# Patient Record
Sex: Female | Born: 1955 | Race: White | Hispanic: No | Marital: Married | Smoking: Never smoker
Health system: Southern US, Community
[De-identification: ages and names within clinical notes are randomized; demographics above are authoritative.]

## PROBLEM LIST (undated history)

## (undated) HISTORY — PX: ABDOMINOPLASTY: SUR9

## (undated) HISTORY — PX: TONSILLECTOMY: SUR1361

## (undated) HISTORY — PX: ABDOMINAL HYSTERECTOMY: SHX81

## (undated) HISTORY — PX: TUBAL LIGATION: SHX77

---

## 1999-05-18 ENCOUNTER — Other Ambulatory Visit: Admission: RE | Admit: 1999-05-18 | Discharge: 1999-05-18 | Payer: Self-pay | Admitting: Obstetrics & Gynecology

## 2000-04-02 ENCOUNTER — Other Ambulatory Visit: Admission: RE | Admit: 2000-04-02 | Discharge: 2000-04-02 | Payer: Self-pay | Admitting: Obstetrics & Gynecology

## 2000-05-01 ENCOUNTER — Other Ambulatory Visit: Admission: RE | Admit: 2000-05-01 | Discharge: 2000-05-01 | Payer: Self-pay | Admitting: Obstetrics & Gynecology

## 2000-09-20 ENCOUNTER — Other Ambulatory Visit: Admission: RE | Admit: 2000-09-20 | Discharge: 2000-09-20 | Payer: Self-pay | Admitting: Obstetrics & Gynecology

## 2000-11-12 ENCOUNTER — Encounter: Payer: Self-pay | Admitting: Family Medicine

## 2001-01-09 ENCOUNTER — Other Ambulatory Visit: Admission: RE | Admit: 2001-01-09 | Discharge: 2001-01-09 | Payer: Self-pay | Admitting: Obstetrics & Gynecology

## 2001-08-05 ENCOUNTER — Other Ambulatory Visit: Admission: RE | Admit: 2001-08-05 | Discharge: 2001-08-05 | Payer: Self-pay | Admitting: Obstetrics & Gynecology

## 2001-12-30 ENCOUNTER — Encounter: Admission: RE | Admit: 2001-12-30 | Discharge: 2001-12-30 | Payer: Self-pay | Admitting: Obstetrics & Gynecology

## 2001-12-30 ENCOUNTER — Encounter: Payer: Self-pay | Admitting: Obstetrics & Gynecology

## 2002-01-27 ENCOUNTER — Other Ambulatory Visit: Admission: RE | Admit: 2002-01-27 | Discharge: 2002-01-27 | Payer: Self-pay | Admitting: Obstetrics & Gynecology

## 2002-08-06 ENCOUNTER — Other Ambulatory Visit: Admission: RE | Admit: 2002-08-06 | Discharge: 2002-08-06 | Payer: Self-pay | Admitting: Obstetrics & Gynecology

## 2003-01-15 ENCOUNTER — Inpatient Hospital Stay (HOSPITAL_COMMUNITY): Admission: RE | Admit: 2003-01-15 | Discharge: 2003-01-17 | Payer: Self-pay | Admitting: Obstetrics & Gynecology

## 2003-12-28 ENCOUNTER — Encounter: Admission: RE | Admit: 2003-12-28 | Discharge: 2003-12-28 | Payer: Self-pay | Admitting: Obstetrics & Gynecology

## 2004-01-12 ENCOUNTER — Other Ambulatory Visit: Admission: RE | Admit: 2004-01-12 | Discharge: 2004-01-12 | Payer: Self-pay | Admitting: Obstetrics & Gynecology

## 2016-09-26 ENCOUNTER — Other Ambulatory Visit: Payer: Self-pay | Admitting: Dermatology

## 2017-09-19 ENCOUNTER — Emergency Department (HOSPITAL_COMMUNITY): Payer: BLUE CROSS/BLUE SHIELD

## 2017-09-19 ENCOUNTER — Encounter (HOSPITAL_COMMUNITY): Payer: Self-pay | Admitting: *Deleted

## 2017-09-19 ENCOUNTER — Other Ambulatory Visit: Payer: Self-pay

## 2017-09-19 ENCOUNTER — Emergency Department (HOSPITAL_COMMUNITY)
Admission: EM | Admit: 2017-09-19 | Discharge: 2017-09-19 | Disposition: A | Discharge status: Home / Self Care | Payer: BLUE CROSS/BLUE SHIELD | Attending: Emergency Medicine | Admitting: Emergency Medicine

## 2017-09-19 DIAGNOSIS — Y9352 Activity, horseback riding: Secondary | ICD-10-CM | POA: Insufficient documentation

## 2017-09-19 DIAGNOSIS — S4991XA Unspecified injury of right shoulder and upper arm, initial encounter: Secondary | ICD-10-CM | POA: Diagnosis present

## 2017-09-19 DIAGNOSIS — S42021A Displaced fracture of shaft of right clavicle, initial encounter for closed fracture: Secondary | ICD-10-CM | POA: Insufficient documentation

## 2017-09-19 DIAGNOSIS — Y929 Unspecified place or not applicable: Secondary | ICD-10-CM | POA: Insufficient documentation

## 2017-09-19 DIAGNOSIS — Y998 Other external cause status: Secondary | ICD-10-CM | POA: Insufficient documentation

## 2017-09-19 DIAGNOSIS — S20211A Contusion of right front wall of thorax, initial encounter: Secondary | ICD-10-CM | POA: Insufficient documentation

## 2017-09-19 MED ORDER — HYDROCODONE-ACETAMINOPHEN 5-325 MG PO TABS: 1.0000 | ORAL_TABLET | Freq: Four times a day (QID) | ORAL | 0 refills | Status: AC | PRN

## 2017-09-19 MED ORDER — HYDROMORPHONE HCL 1 MG/ML IJ SOLN
0.5000 mg | Freq: Once | INTRAMUSCULAR | Status: AC
  Administered 2017-09-19: 0.5 mg via INTRAVENOUS
  Filled 2017-09-19: qty 1

## 2017-09-19 MED ORDER — KETOROLAC TROMETHAMINE 15 MG/ML IJ SOLN
15.0000 mg | Freq: Once | INTRAMUSCULAR | Status: AC
  Administered 2017-09-19: 15 mg via INTRAVENOUS
  Filled 2017-09-19: qty 1

## 2017-09-19 NOTE — ED Notes (Signed)
Pt stable, ambulatory, states understanding of discharge instructions 

## 2017-09-19 NOTE — Progress Notes (Signed)
Orthopedic Tech Progress Note Patient Details:  Roselee NovaConnie K Forton [birth year] [id number]  Ortho Devices Type of Ortho Device: Sling immobilizer Ortho Device/Splint Location: rue Ortho Device/Splint Interventions: Ordered, Application, Adjustment   Post Interventions Patient Tolerated: Well Instructions Provided: Care of device, Adjustment of device   Trinna PostMartinez, Eathon Valade J 09/19/2017, 10:36 PM

## 2017-09-19 NOTE — ED Triage Notes (Signed)
Pt fell ~165ft from a horse. C/o R clavicle and R ribcage pain. Towel rolled c-collar in place  EMS gave 75mcg Fentanyl enroute

## 2017-09-19 NOTE — ED Notes (Signed)
Ortho tech paged  

## 2017-09-19 NOTE — ED Provider Notes (Signed)
[facility]   CSN: [id number] Arrival date & time: 09/19/17  2020     History   Chief Complaint Chief Complaint  Patient presents with  . Fall    HPI Penny NovaConnie K Daniels is a 62 y.o. female.  HPI   62 year old female presenting after fall from horse.  Happened just before arrival.  She is complaining of pain in her right clavicle/shoulder and her right chest.  She was wearing a helmet.  She denies any significant headache, neck or back pain.  She has been amatory since the accident denies any lower extremity pain hip pain or abdominal pain.  Pain is worse with movement and deep breaths.  He is not anticoagulated.  History reviewed. No pertinent past medical history.  There are no active problems to display for this patient.   Past Surgical History:  Procedure Laterality Date  . ABDOMINAL HYSTERECTOMY    . ABDOMINOPLASTY    . TONSILLECTOMY    . TUBAL LIGATION       OB History   None      Home Medications    Prior to Admission medications   Not on File    Family History No family history on file.  Social History Social History   Tobacco Use  . Smoking status: Never Smoker  . Smokeless tobacco: Never Used  Substance Use Topics  . Alcohol use: Yes  . Drug use: Never     Allergies   Latex   Review of Systems Review of Systems  All systems reviewed and negative, other than as noted in HPI. Physical Exam Updated Vital Signs BP (!) 143/72   Pulse 87   Temp 98.3 F (36.8 C)   Resp 16   SpO2 100%   Physical Exam  Constitutional: She appears well-developed and well-nourished. No distress.  HENT:  Head: Normocephalic and atraumatic.  Eyes: Conjunctivae are normal. Right eye exhibits no discharge. Left eye exhibits no discharge.  Neck: Neck supple.  Cardiovascular: Normal rate, regular rhythm and normal heart sounds. Exam reveals no gallop and no friction rub.  No murmur  heard. Pulmonary/Chest: Effort normal and breath sounds normal. No respiratory distress. She exhibits tenderness.  Tenderness along the right lateral chest wall.  No crepitus.  No overlying skin changes.  Breath sounds are clear and symmetric bilaterally.  Abdominal: Soft. She exhibits no distension. There is no tenderness.  Musculoskeletal: She exhibits no edema or tenderness.  Visible/palpable deformity to the mid right clavicle.  No bony tenderness of the shoulder.  Closed injury.  Neurovascularly intact.  No midline spinal tenderness.  Neurological: She is alert.  Skin: Skin is warm and dry.  Psychiatric: She has a normal mood and affect. Her behavior is normal. Thought content normal.  Nursing note and vitals reviewed.    ED Treatments / Results  Labs (all labs ordered are listed, but only abnormal results are displayed) Labs Reviewed - No data to display  EKG None  Radiology No results found.   Dg Ribs Unilateral W/chest Right  Result Date: 09/19/2017 CLINICAL DATA:  Fall with clavicle deformity EXAM: RIGHT RIBS AND CHEST - 3+ VIEW COMPARISON:  01/15/2003 FINDINGS: Single-view chest demonstrates no acute opacity or pleural effusion. Normal heart size. No pneumothorax. Acute comminuted fracture mid right clavicle. Right rib series demonstrates no acute displaced right rib fracture. IMPRESSION: 1. Negative for pneumothorax or pleural effusion 2. No acute displaced right rib fracture 3. Acute fracture mid right clavicle Electronically  Signed   By: Jasmine Pang M.D.   On: 09/19/2017 21:59   Dg Shoulder Right  Result Date: 09/19/2017 CLINICAL DATA:  Fall from horse right shoulder pain with deformity to the clavicle EXAM: RIGHT SHOULDER - 2+ VIEW COMPARISON:  None. FINDINGS: No fracture or malalignment of the glenohumeral interval. AC joint is intact. Acute comminuted fracture midshaft of the clavicle with 1 bone with of displacement and about 2 cm of overriding. IMPRESSION: 1. Acute  comminuted displaced and overriding fracture of the mid right clavicle Electronically Signed   By: Jasmine Pang M.D.   On: 09/19/2017 21:57    Procedures Procedures (including critical care time)  Medications Ordered in ED Medications  HYDROmorphone (DILAUDID) injection 0.5 mg (0.5 mg Intravenous Given 09/19/17 2116)  ketorolac (TORADOL) 15 MG/ML injection 15 mg (15 mg Intravenous Given 09/19/17 2115)     Initial Impression / Assessment and Plan / ED Course  I have reviewed the triage vital signs and the nursing notes.  Pertinent labs & imaging results that were available during my care of the patient were reviewed by me and considered in my medical decision making (see chart for details).     Fall from horse.  Was helmeted.  Denies any significant headache or neck pain.  Nonfocal neurologic examination.  Displaced/overriding right clavicle fracture.  Closed injury.  Neurovascularly intact.  Chest wall pain and tenderness without evidence of rib fracture or pneumothorax.  Plan sling.  As needed pain medication.  Orthopedic surgery follow-up this upcoming week.  Final Clinical Impressions(s) / ED Diagnoses   Final diagnoses:  Closed displaced fracture of shaft of right clavicle, initial encounter  Contusion of right chest wall, initial encounter    ED Discharge Orders    None       Raeford Razor, MD 09/23/17 2020

## 2019-02-12 IMAGING — CR DG SHOULDER 2+V*R*
2 series · 2 of 2 positions shown · non-contrast
Comparison: None.

CLINICAL DATA: Fall from horse right shoulder pain with deformity
to the clavicle

EXAM:
RIGHT SHOULDER - 2+ VIEW

[shoulder grashey]
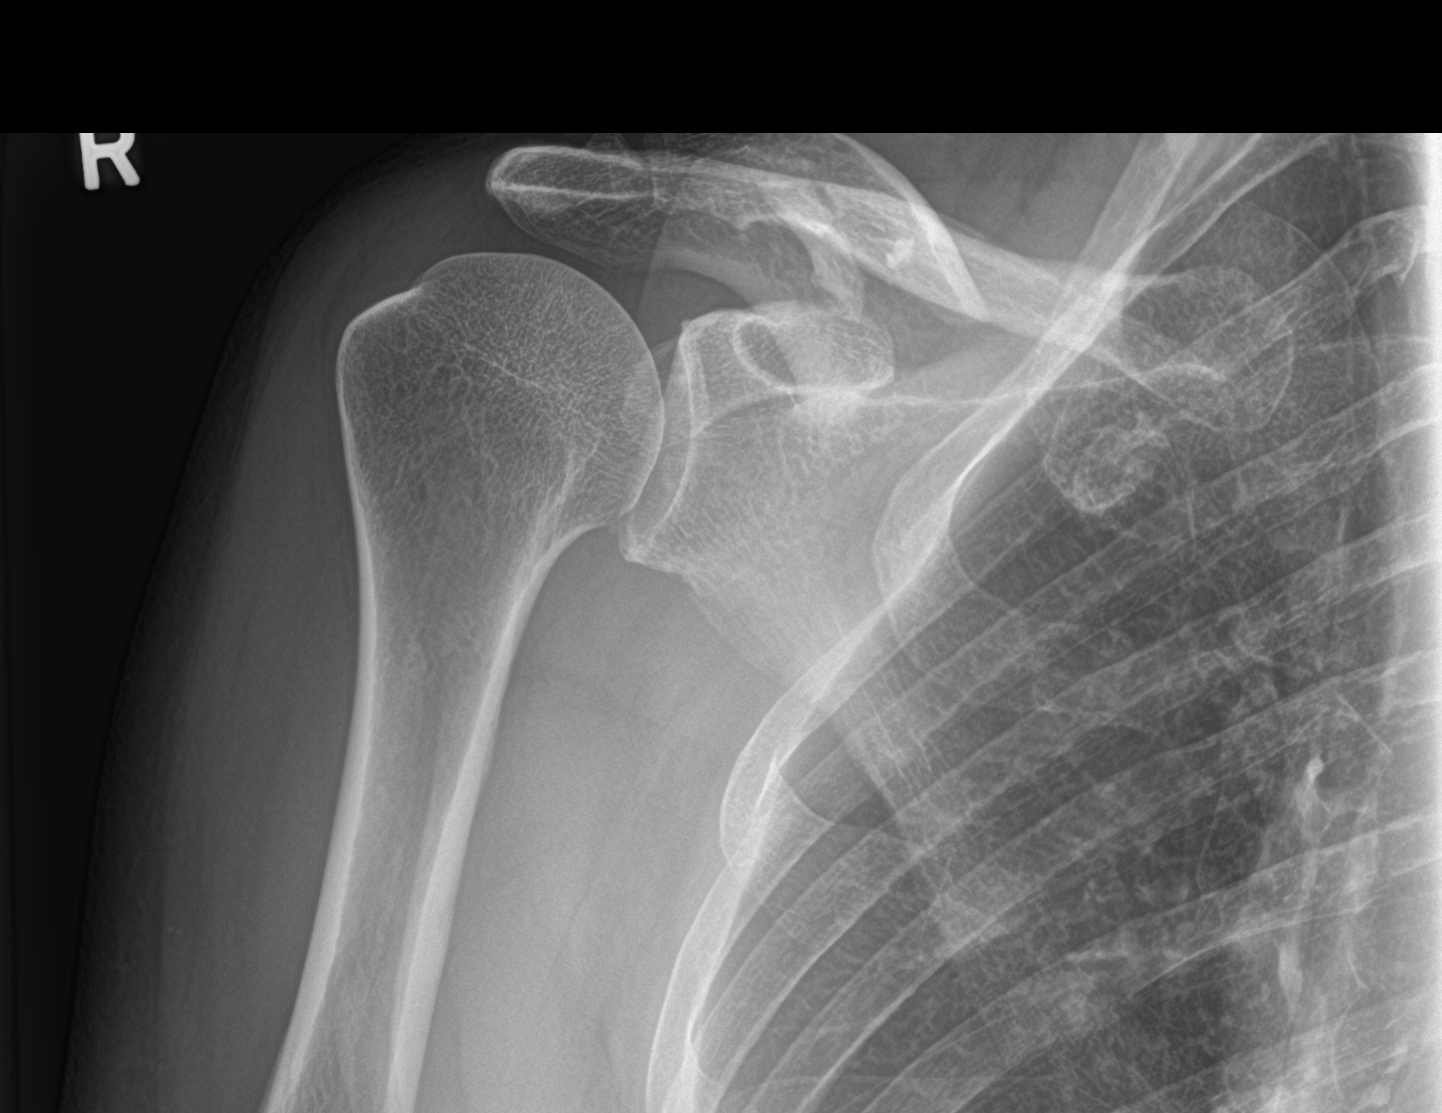

[shoulder y view]
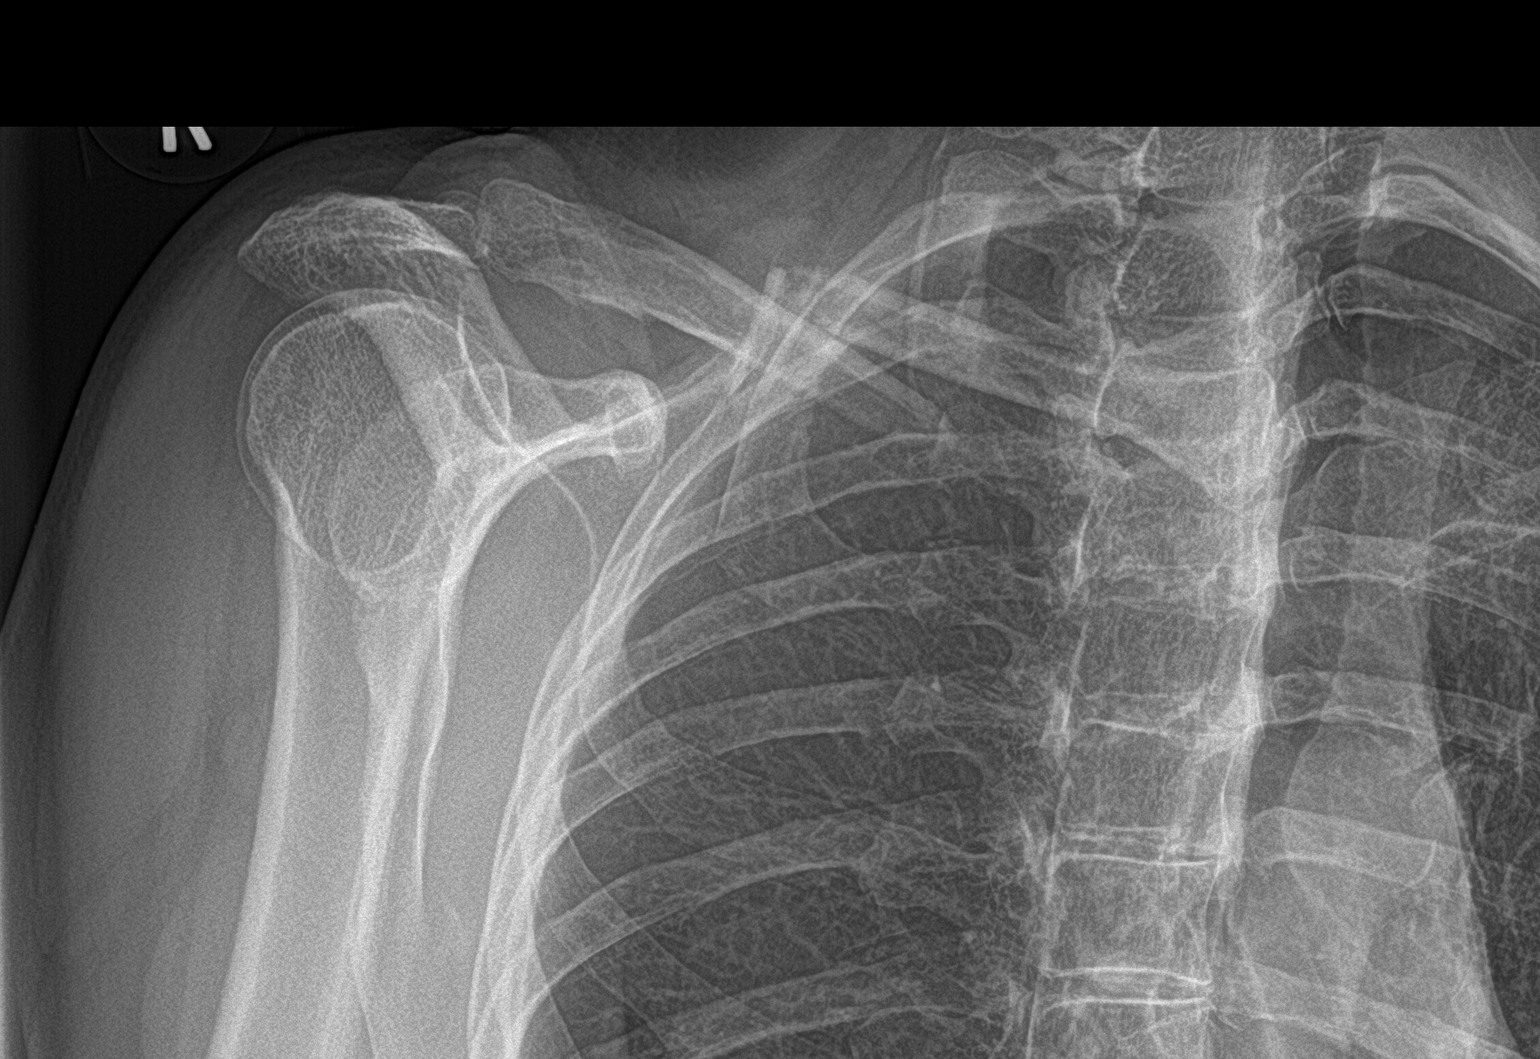

[2 of 2 positions shown; findings below may reference images not displayed]

FINDINGS: No fracture or malalignment of the glenohumeral interval. AC joint
is intact.

Acute comminuted fracture midshaft of the clavicle with 1 bone with
of displacement and about 2 cm of overriding.
IMPRESSION: 1. Acute comminuted displaced and overriding fracture of the mid
right clavicle
